# Patient Record
Sex: Male | Born: 2000 | Race: White | Hispanic: No | Marital: Single | Smoking: Former smoker
Health system: Southern US, Community
[De-identification: ages and names within clinical notes are randomized; demographics above are authoritative.]

## PROBLEM LIST (undated history)

## (undated) DIAGNOSIS — F329 Major depressive disorder, single episode, unspecified: Secondary | ICD-10-CM

## (undated) DIAGNOSIS — F32A Depression, unspecified: Secondary | ICD-10-CM

## (undated) DIAGNOSIS — K2970 Gastritis, unspecified, without bleeding: Secondary | ICD-10-CM

## (undated) DIAGNOSIS — F419 Anxiety disorder, unspecified: Secondary | ICD-10-CM

## (undated) HISTORY — DX: Gastritis, unspecified, without bleeding: K29.70

## (undated) HISTORY — PX: ADENOIDECTOMY: SUR15

## (undated) HISTORY — PX: OTHER SURGICAL HISTORY: SHX169

---
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
## 2020-07-15 DIAGNOSIS — U071 COVID-19: Secondary | ICD-10-CM | POA: Diagnosis not present

## 2020-08-11 DIAGNOSIS — F1120 Opioid dependence, uncomplicated: Secondary | ICD-10-CM | POA: Diagnosis not present

## 2020-08-15 DIAGNOSIS — F1120 Opioid dependence, uncomplicated: Secondary | ICD-10-CM | POA: Diagnosis not present

## 2021-03-06 DIAGNOSIS — F1120 Opioid dependence, uncomplicated: Secondary | ICD-10-CM | POA: Diagnosis not present

## 2021-04-17 DIAGNOSIS — F1120 Opioid dependence, uncomplicated: Secondary | ICD-10-CM | POA: Diagnosis not present

## 2021-05-02 DIAGNOSIS — Z113 Encounter for screening for infections with a predominantly sexual mode of transmission: Secondary | ICD-10-CM | POA: Diagnosis not present

## 2021-05-02 DIAGNOSIS — F1111 Opioid abuse, in remission: Secondary | ICD-10-CM | POA: Diagnosis not present

## 2021-05-02 DIAGNOSIS — G4719 Other hypersomnia: Secondary | ICD-10-CM | POA: Diagnosis not present

## 2021-05-02 DIAGNOSIS — Z13220 Encounter for screening for lipoid disorders: Secondary | ICD-10-CM | POA: Diagnosis not present

## 2021-05-02 DIAGNOSIS — Z0000 Encounter for general adult medical examination without abnormal findings: Secondary | ICD-10-CM | POA: Diagnosis not present

## 2021-05-03 DIAGNOSIS — F1120 Opioid dependence, uncomplicated: Secondary | ICD-10-CM | POA: Diagnosis not present

## 2021-05-22 DIAGNOSIS — F1120 Opioid dependence, uncomplicated: Secondary | ICD-10-CM | POA: Diagnosis not present

## 2021-06-19 DIAGNOSIS — F1120 Opioid dependence, uncomplicated: Secondary | ICD-10-CM | POA: Diagnosis not present

## 2021-06-25 DIAGNOSIS — M545 Low back pain, unspecified: Secondary | ICD-10-CM | POA: Diagnosis not present

## 2021-06-25 DIAGNOSIS — M7062 Trochanteric bursitis, left hip: Secondary | ICD-10-CM | POA: Diagnosis not present

## 2021-06-25 DIAGNOSIS — M25552 Pain in left hip: Secondary | ICD-10-CM | POA: Diagnosis not present

## 2021-07-09 DIAGNOSIS — J069 Acute upper respiratory infection, unspecified: Secondary | ICD-10-CM | POA: Diagnosis not present

## 2021-07-29 DIAGNOSIS — F1120 Opioid dependence, uncomplicated: Secondary | ICD-10-CM | POA: Diagnosis not present

## 2021-08-14 DIAGNOSIS — F1120 Opioid dependence, uncomplicated: Secondary | ICD-10-CM | POA: Diagnosis not present

## 2021-08-28 DIAGNOSIS — F1120 Opioid dependence, uncomplicated: Secondary | ICD-10-CM | POA: Diagnosis not present

## 2021-09-11 DIAGNOSIS — F1120 Opioid dependence, uncomplicated: Secondary | ICD-10-CM | POA: Diagnosis not present

## 2021-09-17 DIAGNOSIS — F1120 Opioid dependence, uncomplicated: Secondary | ICD-10-CM | POA: Diagnosis not present

## 2021-09-24 DIAGNOSIS — F1120 Opioid dependence, uncomplicated: Secondary | ICD-10-CM | POA: Diagnosis not present

## 2021-09-25 DIAGNOSIS — F1120 Opioid dependence, uncomplicated: Secondary | ICD-10-CM | POA: Diagnosis not present

## 2021-09-26 DIAGNOSIS — F1120 Opioid dependence, uncomplicated: Secondary | ICD-10-CM | POA: Diagnosis not present

## 2022-01-07 DIAGNOSIS — F1120 Opioid dependence, uncomplicated: Secondary | ICD-10-CM | POA: Diagnosis not present

## 2022-01-08 DIAGNOSIS — F1120 Opioid dependence, uncomplicated: Secondary | ICD-10-CM | POA: Diagnosis not present

## 2022-01-09 DIAGNOSIS — F1120 Opioid dependence, uncomplicated: Secondary | ICD-10-CM | POA: Diagnosis not present

## 2022-01-14 DIAGNOSIS — F1120 Opioid dependence, uncomplicated: Secondary | ICD-10-CM | POA: Diagnosis not present

## 2022-01-21 DIAGNOSIS — F1120 Opioid dependence, uncomplicated: Secondary | ICD-10-CM | POA: Diagnosis not present

## 2022-01-22 DIAGNOSIS — F1120 Opioid dependence, uncomplicated: Secondary | ICD-10-CM | POA: Diagnosis not present

## 2022-02-01 DIAGNOSIS — F1120 Opioid dependence, uncomplicated: Secondary | ICD-10-CM | POA: Diagnosis not present

## 2022-02-04 DIAGNOSIS — F1120 Opioid dependence, uncomplicated: Secondary | ICD-10-CM | POA: Diagnosis not present

## 2022-02-05 DIAGNOSIS — F1120 Opioid dependence, uncomplicated: Secondary | ICD-10-CM | POA: Diagnosis not present

## 2022-02-06 DIAGNOSIS — F1120 Opioid dependence, uncomplicated: Secondary | ICD-10-CM | POA: Diagnosis not present

## 2022-02-11 DIAGNOSIS — F1120 Opioid dependence, uncomplicated: Secondary | ICD-10-CM | POA: Diagnosis not present

## 2022-02-18 DIAGNOSIS — F1120 Opioid dependence, uncomplicated: Secondary | ICD-10-CM | POA: Diagnosis not present

## 2022-02-19 DIAGNOSIS — F1120 Opioid dependence, uncomplicated: Secondary | ICD-10-CM | POA: Diagnosis not present

## 2022-02-20 DIAGNOSIS — F1120 Opioid dependence, uncomplicated: Secondary | ICD-10-CM | POA: Diagnosis not present

## 2022-02-25 DIAGNOSIS — F1120 Opioid dependence, uncomplicated: Secondary | ICD-10-CM | POA: Diagnosis not present

## 2022-03-04 DIAGNOSIS — F1120 Opioid dependence, uncomplicated: Secondary | ICD-10-CM | POA: Diagnosis not present

## 2022-03-05 DIAGNOSIS — F1120 Opioid dependence, uncomplicated: Secondary | ICD-10-CM | POA: Diagnosis not present

## 2022-03-06 DIAGNOSIS — F1120 Opioid dependence, uncomplicated: Secondary | ICD-10-CM | POA: Diagnosis not present

## 2022-03-11 DIAGNOSIS — F1120 Opioid dependence, uncomplicated: Secondary | ICD-10-CM | POA: Diagnosis not present

## 2022-03-18 DIAGNOSIS — F1120 Opioid dependence, uncomplicated: Secondary | ICD-10-CM | POA: Diagnosis not present

## 2022-03-25 DIAGNOSIS — F1120 Opioid dependence, uncomplicated: Secondary | ICD-10-CM | POA: Diagnosis not present

## 2022-05-04 DIAGNOSIS — F1120 Opioid dependence, uncomplicated: Secondary | ICD-10-CM | POA: Diagnosis not present

## 2022-05-18 DIAGNOSIS — F1120 Opioid dependence, uncomplicated: Secondary | ICD-10-CM | POA: Diagnosis not present

## 2022-06-08 DIAGNOSIS — F1120 Opioid dependence, uncomplicated: Secondary | ICD-10-CM | POA: Diagnosis not present

## 2022-06-15 DIAGNOSIS — F1120 Opioid dependence, uncomplicated: Secondary | ICD-10-CM | POA: Diagnosis not present

## 2022-06-29 DIAGNOSIS — F1120 Opioid dependence, uncomplicated: Secondary | ICD-10-CM | POA: Diagnosis not present

## 2022-07-02 DIAGNOSIS — F1120 Opioid dependence, uncomplicated: Secondary | ICD-10-CM | POA: Diagnosis not present

## 2022-07-13 DIAGNOSIS — F1120 Opioid dependence, uncomplicated: Secondary | ICD-10-CM | POA: Diagnosis not present

## 2022-07-16 DIAGNOSIS — F1120 Opioid dependence, uncomplicated: Secondary | ICD-10-CM | POA: Diagnosis not present

## 2022-07-27 DIAGNOSIS — F1120 Opioid dependence, uncomplicated: Secondary | ICD-10-CM | POA: Diagnosis not present

## 2022-08-10 DIAGNOSIS — F1120 Opioid dependence, uncomplicated: Secondary | ICD-10-CM | POA: Diagnosis not present

## 2022-08-13 DIAGNOSIS — F1120 Opioid dependence, uncomplicated: Secondary | ICD-10-CM | POA: Diagnosis not present

## 2022-08-17 DIAGNOSIS — F1120 Opioid dependence, uncomplicated: Secondary | ICD-10-CM | POA: Diagnosis not present

## 2022-08-24 DIAGNOSIS — F1120 Opioid dependence, uncomplicated: Secondary | ICD-10-CM | POA: Diagnosis not present

## 2022-09-07 DIAGNOSIS — F1120 Opioid dependence, uncomplicated: Secondary | ICD-10-CM | POA: Diagnosis not present

## 2022-10-05 DIAGNOSIS — F1120 Opioid dependence, uncomplicated: Secondary | ICD-10-CM | POA: Diagnosis not present

## 2022-10-08 DIAGNOSIS — F1120 Opioid dependence, uncomplicated: Secondary | ICD-10-CM | POA: Diagnosis not present

## 2022-10-12 DIAGNOSIS — F1120 Opioid dependence, uncomplicated: Secondary | ICD-10-CM | POA: Diagnosis not present

## 2022-11-02 DIAGNOSIS — F1120 Opioid dependence, uncomplicated: Secondary | ICD-10-CM | POA: Diagnosis not present

## 2022-11-05 DIAGNOSIS — F1120 Opioid dependence, uncomplicated: Secondary | ICD-10-CM | POA: Diagnosis not present

## 2022-11-09 DIAGNOSIS — F1120 Opioid dependence, uncomplicated: Secondary | ICD-10-CM | POA: Diagnosis not present

## 2022-11-12 DIAGNOSIS — F1120 Opioid dependence, uncomplicated: Secondary | ICD-10-CM | POA: Diagnosis not present

## 2022-11-23 DIAGNOSIS — F1120 Opioid dependence, uncomplicated: Secondary | ICD-10-CM | POA: Diagnosis not present

## 2022-11-30 DIAGNOSIS — F1120 Opioid dependence, uncomplicated: Secondary | ICD-10-CM | POA: Diagnosis not present

## 2022-12-07 DIAGNOSIS — F1120 Opioid dependence, uncomplicated: Secondary | ICD-10-CM | POA: Diagnosis not present

## 2022-12-10 DIAGNOSIS — F1120 Opioid dependence, uncomplicated: Secondary | ICD-10-CM | POA: Diagnosis not present

## 2022-12-14 DIAGNOSIS — F1120 Opioid dependence, uncomplicated: Secondary | ICD-10-CM | POA: Diagnosis not present

## 2022-12-21 DIAGNOSIS — F1120 Opioid dependence, uncomplicated: Secondary | ICD-10-CM | POA: Diagnosis not present

## 2022-12-28 DIAGNOSIS — F1120 Opioid dependence, uncomplicated: Secondary | ICD-10-CM | POA: Diagnosis not present

## 2023-01-04 DIAGNOSIS — F1120 Opioid dependence, uncomplicated: Secondary | ICD-10-CM | POA: Diagnosis not present

## 2023-01-18 DIAGNOSIS — F1120 Opioid dependence, uncomplicated: Secondary | ICD-10-CM | POA: Diagnosis not present

## 2023-01-21 DIAGNOSIS — F1120 Opioid dependence, uncomplicated: Secondary | ICD-10-CM | POA: Diagnosis not present

## 2023-02-01 DIAGNOSIS — F1120 Opioid dependence, uncomplicated: Secondary | ICD-10-CM | POA: Diagnosis not present

## 2023-02-15 DIAGNOSIS — F1120 Opioid dependence, uncomplicated: Secondary | ICD-10-CM | POA: Diagnosis not present

## 2023-02-18 ENCOUNTER — Encounter (HOSPITAL_COMMUNITY): Payer: Self-pay

## 2023-02-18 ENCOUNTER — Other Ambulatory Visit: Payer: Self-pay

## 2023-02-18 ENCOUNTER — Emergency Department (HOSPITAL_COMMUNITY)
Admission: EM | Admit: 2023-02-18 | Discharge: 2023-02-18 | Disposition: A | Discharge status: Home / Self Care | Payer: BC Managed Care – PPO | Attending: Emergency Medicine | Admitting: Emergency Medicine

## 2023-02-18 DIAGNOSIS — Z0000 Encounter for general adult medical examination without abnormal findings: Secondary | ICD-10-CM | POA: Diagnosis not present

## 2023-02-18 DIAGNOSIS — Z7689 Persons encountering health services in other specified circumstances: Secondary | ICD-10-CM | POA: Diagnosis not present

## 2023-02-18 MED ORDER — METHADONE HCL 10 MG PO TABS
180.0000 mg | ORAL_TABLET | Freq: Once | ORAL | Status: AC
  Administered 2023-02-18: 180 mg via ORAL
  Filled 2023-02-18: qty 18

## 2023-02-18 MED ORDER — METHADONE HCL 10 MG/ML PO CONC: 180.0000 mg | Freq: Once | ORAL | Status: DC

## 2023-02-18 NOTE — ED Notes (Signed)
Pt is here to get methadone dose. Reports he has been taking methadone for 4 years and gets his refills 2 weeks at a time. Pt states last dose was yesterday and today was supposed to be when he gets his refill. Pt was late to the appointment and was referred here. Denies any pain or discomfort. Alert and oriented x 4. Will continue to monitor.

## 2023-02-18 NOTE — ED Triage Notes (Signed)
Pt states director of methadone clinic told pt to come here to get methadone medication, because he was late going to their clinic.

## 2023-02-18 NOTE — ED Provider Notes (Signed)
[employer] EMERGENCY DEPARTMENT AT [facility] Provider Note   CSN: [id number] Arrival date & time: 02/18/23  1145     History  Chief Complaint  Patient presents with   need methadone medication    [name] is a 22 y.o. male.  With history of anxiety, depression, gastritis who presents to the ED requesting a dose of his methadone.  He gets 180 mg of methadone daily.  He states he was approximately 5 minutes late for his dosing today and states the methadone clinic turned him away and told him to come to the emergency department for his dose. It has been 24 hours since his last dose.  He states he is starting to feel more anxious at this time.  Denies any other complaints.  patient [facility] [place].  Spoke with [facility] program director, Fraser Din, who provides the same history as the patient.  States he told the to come to the emergency department for his dose.  States he has done this before and has been told by his patient here Wellsite geologist states that this is agreeable on occasion.  He states he contacted other treatment centers in [place] but states they were all closed.  HPI     Home Medications Prior to Admission medications   Not on File      Allergies    Patient has no known allergies.    Review of Systems   Review of Systems  All other systems reviewed and are negative.   Physical Exam Updated Vital Signs BP (!) 137/90 (BP Location: Right Arm)   Pulse 90   Temp 98 F (36.7 C) (Axillary)   Resp 15   Ht 5\' 9"  (1.753 m)   Wt 67.5 kg   SpO2 100%   BMI 21.98 kg/m  Physical Exam Vitals and nursing note reviewed.  Constitutional:      General: He is not in acute distress.    Appearance: Normal appearance. He is normal weight. He is not ill-appearing.     Comments: Resting comfortably in bed.  No acute distress  HENT:     Head: Normocephalic and atraumatic.  Pulmonary:      Effort: Pulmonary effort is normal. No respiratory distress.  Abdominal:     General: Abdomen is flat.  Musculoskeletal:        General: Normal range of motion.     Cervical back: Neck supple.  Skin:    General: Skin is warm and dry.  Neurological:     Mental Status: He is alert and oriented to person, place, and time.  Psychiatric:        Mood and Affect: Mood normal.        Behavior: Behavior normal.     ED Results / Procedures / Treatments   Labs (all labs ordered are listed, but only abnormal results are displayed) Labs Reviewed - No data to display  EKG None  Radiology No results found.  Procedures Procedures    Medications Ordered in ED Medications  methadone (DOLOPHINE) tablet 180 mg (180 mg Oral Given 02/18/23 1350)    ED Course/ Medical Decision Making/ A&P                             Medical Decision Making Risk Prescription drug management.  This patient presents to the ED requesting his dose of methadone as he missed  his dose at his treatment center today  Additional history obtained from: Nursing notes from this visit. [facility] program director Fraser Din  Afebrile, hemodynamically stable.  22 year old male presents to the ED requesting his dose of methadone.  History is described in the HPI.  I spoke with the treatment center.  Patient appears very well.  He is in no acute distress.  He has no complaints.  Patient's history and treatment center history correlate to each other.  Patient's dose of methadone was confirmed with the treatment center.  He was given his dose in the emergency department.  He was encouraged not to miss any more doses.  He was given return precautions.  Stable at discharge.  At this time there does not appear to be any evidence of an acute emergency medical condition and the patient appears stable for discharge with appropriate outpatient follow up. Diagnosis was discussed with patient who verbalizes  understanding of care plan and is agreeable to discharge. I have discussed return precautions with patient who verbalizes understanding. Patient encouraged to follow-up with their PCP within 1 week. All questions answered.  Patient's case discussed with Dr. Lynelle Doctor regarding plan.   Note: Portions of this report may have been transcribed using voice recognition software. Every effort was made to ensure accuracy; however, inadvertent computerized transcription errors may still be present.        Final Clinical Impression(s) / ED Diagnoses Final diagnoses:  Encounter for medication administration    Rx / DC Orders ED Discharge Orders     None         Michelle Piper, Cordelia Poche 02/18/23 1402    Linwood Dibbles, MD 02/19/23 1035

## 2023-02-18 NOTE — Discharge Instructions (Signed)
You are seen today for your dose of methadone.  Please inform your treatment center that this is not appropriate.  Follow-up in the emergency department with any new or worsening symptoms.

## 2023-02-19 DIAGNOSIS — F1120 Opioid dependence, uncomplicated: Secondary | ICD-10-CM | POA: Diagnosis not present

## 2023-03-01 DIAGNOSIS — F1120 Opioid dependence, uncomplicated: Secondary | ICD-10-CM | POA: Diagnosis not present

## 2023-03-15 DIAGNOSIS — F1120 Opioid dependence, uncomplicated: Secondary | ICD-10-CM | POA: Diagnosis not present

## 2023-03-18 DIAGNOSIS — F1120 Opioid dependence, uncomplicated: Secondary | ICD-10-CM | POA: Diagnosis not present

## 2023-04-01 DIAGNOSIS — F1120 Opioid dependence, uncomplicated: Secondary | ICD-10-CM | POA: Diagnosis not present

## 2023-04-15 DIAGNOSIS — F1120 Opioid dependence, uncomplicated: Secondary | ICD-10-CM | POA: Diagnosis not present

## 2023-04-29 DIAGNOSIS — F1120 Opioid dependence, uncomplicated: Secondary | ICD-10-CM | POA: Diagnosis not present

## 2023-05-13 DIAGNOSIS — F1120 Opioid dependence, uncomplicated: Secondary | ICD-10-CM | POA: Diagnosis not present

## 2023-05-27 DIAGNOSIS — F1120 Opioid dependence, uncomplicated: Secondary | ICD-10-CM | POA: Diagnosis not present

## 2023-06-10 DIAGNOSIS — F1120 Opioid dependence, uncomplicated: Secondary | ICD-10-CM | POA: Diagnosis not present

## 2023-06-24 DIAGNOSIS — F1120 Opioid dependence, uncomplicated: Secondary | ICD-10-CM | POA: Diagnosis not present

## 2023-07-08 DIAGNOSIS — F1120 Opioid dependence, uncomplicated: Secondary | ICD-10-CM | POA: Diagnosis not present

## 2023-07-22 DIAGNOSIS — F1120 Opioid dependence, uncomplicated: Secondary | ICD-10-CM | POA: Diagnosis not present

## 2023-08-05 DIAGNOSIS — F1120 Opioid dependence, uncomplicated: Secondary | ICD-10-CM | POA: Diagnosis not present
